# Patient Record
Sex: Female | Born: 1960 | Race: White | Hispanic: No | Marital: Single | Smoking: Never smoker
Health system: Southern US, Community
[De-identification: ages and names within clinical notes are randomized; demographics above are authoritative.]

## PROBLEM LIST (undated history)

## (undated) DIAGNOSIS — N289 Disorder of kidney and ureter, unspecified: Secondary | ICD-10-CM

## (undated) DIAGNOSIS — N6009 Solitary cyst of unspecified breast: Secondary | ICD-10-CM

## (undated) DIAGNOSIS — Z1239 Encounter for other screening for malignant neoplasm of breast: Secondary | ICD-10-CM

## (undated) DIAGNOSIS — N63 Unspecified lump in unspecified breast: Secondary | ICD-10-CM

## (undated) HISTORY — DX: Encounter for other screening for malignant neoplasm of breast: Z12.39

## (undated) HISTORY — DX: Solitary cyst of unspecified breast: N60.09

## (undated) HISTORY — DX: Unspecified lump in unspecified breast: N63.0

## (undated) HISTORY — PX: BREAST CYST ASPIRATION: SHX578

---

## 1999-09-20 HISTORY — PX: ABDOMINAL HYSTERECTOMY: SHX81

## 2009-04-28 ENCOUNTER — Ambulatory Visit: Payer: Self-pay | Admitting: Physician Assistant

## 2009-06-04 ENCOUNTER — Ambulatory Visit: Payer: Self-pay | Admitting: Pain Medicine

## 2009-07-01 ENCOUNTER — Ambulatory Visit: Payer: Self-pay | Admitting: Physician Assistant

## 2009-07-23 ENCOUNTER — Ambulatory Visit: Payer: Self-pay | Admitting: Physician Assistant

## 2009-08-20 ENCOUNTER — Ambulatory Visit: Payer: Self-pay | Admitting: Physician Assistant

## 2009-10-19 ENCOUNTER — Ambulatory Visit: Payer: Self-pay | Admitting: Pain Medicine

## 2009-12-21 ENCOUNTER — Ambulatory Visit: Payer: Self-pay | Admitting: Pain Medicine

## 2010-04-26 ENCOUNTER — Emergency Department: Payer: Self-pay | Admitting: Emergency Medicine

## 2011-09-20 DIAGNOSIS — Z1239 Encounter for other screening for malignant neoplasm of breast: Secondary | ICD-10-CM

## 2011-09-20 DIAGNOSIS — N6009 Solitary cyst of unspecified breast: Secondary | ICD-10-CM

## 2011-09-20 HISTORY — DX: Solitary cyst of unspecified breast: N60.09

## 2011-09-20 HISTORY — DX: Encounter for other screening for malignant neoplasm of breast: Z12.39

## 2012-09-19 DIAGNOSIS — N63 Unspecified lump in unspecified breast: Secondary | ICD-10-CM

## 2012-09-19 HISTORY — DX: Unspecified lump in unspecified breast: N63.0

## 2012-12-24 ENCOUNTER — Encounter: Payer: Self-pay | Admitting: *Deleted

## 2012-12-24 DIAGNOSIS — N6009 Solitary cyst of unspecified breast: Secondary | ICD-10-CM | POA: Insufficient documentation

## 2013-01-01 ENCOUNTER — Ambulatory Visit: Payer: Self-pay | Admitting: General Surgery

## 2013-08-07 ENCOUNTER — Encounter: Payer: Self-pay | Admitting: General Surgery

## 2013-08-27 ENCOUNTER — Encounter: Payer: Self-pay | Admitting: General Surgery

## 2013-08-27 ENCOUNTER — Ambulatory Visit (INDEPENDENT_AMBULATORY_CARE_PROVIDER_SITE_OTHER): Payer: 59 | Admitting: General Surgery

## 2013-08-27 VITALS — BP 124/78 | HR 72 | Resp 14 | Ht 66.0 in | Wt 147.0 lb

## 2013-08-27 DIAGNOSIS — N63 Unspecified lump in unspecified breast: Secondary | ICD-10-CM

## 2013-08-27 NOTE — Progress Notes (Signed)
Patient ID: Isabella Henderson, female   DOB: 02-10-1961, 52 y.o.   MRN: [id number]  Chief Complaint  Patient presents with  . Follow-up    mammogram    HPI Isabella Henderson is a 52 y.o. female.  who presents for her follow up breast evaluation. The most recent mammogram was done on 08-06-13.  Patient does perform regular self breast checks and gets regular mammograms done.  No new breast issues.  HPI  Past Medical History  Diagnosis Date  . Breast screening, unspecified 2013  . Lump or mass in breast 2014    cyst aspiration-right bst, 11o'clock 2013, right breast 6 o'clock 2014, both benign  . Solitary cyst of breast 2013    right breast-2013& 2014, benign    Past Surgical History  Procedure Laterality Date  . Abdominal hysterectomy  2001  . Breast cyst aspiration Right 2013,2014    right bst 11o'clock in 2014 and right breast 6 o'clock in 2014 1cm from nipple, both fna were benign    Family History  Problem Relation Age of Onset  . Cancer Maternal Grandmother 16    breast cancer and colon    Social History History  Substance Use Topics  . Smoking status: Never Smoker   . Smokeless tobacco: Not on file  . Alcohol Use: Yes    Allergies  Allergen Reactions  . Sulfa Antibiotics Nausea Only    Current Outpatient Prescriptions  Medication Sig Dispense Refill  . carisoprodol (SOMA) 350 MG tablet Take 350 mg by mouth every 6 (six) hours as needed for muscle spasms.      . traMADol (ULTRAM) 50 MG tablet Take 50 mg by mouth 3 (three) times daily.      Marland Kitchen ALPRAZolam (XANAX) 0.25 MG tablet Take 0.25 mg by mouth daily.      . Cholecalciferol (VITAMIN D-3 PO) Take 2,000 tablets by mouth daily.       No current facility-administered medications for this visit.    Review of Systems Review of Systems  Constitutional: Negative.   Respiratory: Negative.   Cardiovascular: Negative.     Blood pressure 124/78, pulse 72, resp. rate 14, height 5\' 6"  (1.676 m), weight 147 lb  (66.679 kg).  Physical Exam Physical Exam  Constitutional: She is oriented to person, place, and time. She appears well-developed and well-nourished.  Neck: Neck supple.  Cardiovascular: Normal rate, regular rhythm and normal heart sounds.   Pulmonary/Chest: Effort normal and breath sounds normal. Right breast exhibits no inverted nipple, no mass, no nipple discharge, no skin change and no tenderness. Left breast exhibits no inverted nipple, no mass, no nipple discharge, no skin change and no tenderness.  Lymphadenopathy:    She has no cervical adenopathy.    She has no axillary adenopathy.  Neurological: She is alert and oriented to person, place, and time.  Skin: Skin is warm and dry.    Data Reviewed Bilateral mammograms completed [facility] dated August 06, 2013 were reviewed.dense breast were identified. No areas of suspicious microcalcifications or focal asymmetry were appreciated. BI-RAD-2. FNA sample of a cyst dated November 08, 2012 showed a cellular specimen.  Assessment    Benign breast exam.     Plan    The patient will resume annual screening mammograms with her PCP in November 2015.        Isabella Henderson 08/27/2013, 8:34 PM

## 2013-08-27 NOTE — Patient Instructions (Signed)
Continue self breast exams. Call office for any new breast issues or concerns. Continue yearly checks with primary care Dr Marcello Fennel

## 2013-09-25 ENCOUNTER — Encounter: Payer: Self-pay | Admitting: General Surgery

## 2014-07-21 ENCOUNTER — Encounter: Payer: Self-pay | Admitting: General Surgery

## 2015-02-03 ENCOUNTER — Other Ambulatory Visit: Payer: Self-pay | Admitting: Internal Medicine

## 2015-02-03 DIAGNOSIS — M47816 Spondylosis without myelopathy or radiculopathy, lumbar region: Secondary | ICD-10-CM

## 2015-02-03 DIAGNOSIS — M4310 Spondylolisthesis, site unspecified: Secondary | ICD-10-CM

## 2015-02-11 ENCOUNTER — Ambulatory Visit
Admission: RE | Admit: 2015-02-11 | Discharge: 2015-02-11 | Disposition: A | Discharge status: Home / Self Care | Payer: 59 | Source: Ambulatory Visit | Attending: Internal Medicine | Admitting: Internal Medicine

## 2015-02-11 DIAGNOSIS — M479 Spondylosis, unspecified: Secondary | ICD-10-CM | POA: Diagnosis present

## 2015-02-11 DIAGNOSIS — M47816 Spondylosis without myelopathy or radiculopathy, lumbar region: Secondary | ICD-10-CM

## 2015-02-11 DIAGNOSIS — M4310 Spondylolisthesis, site unspecified: Secondary | ICD-10-CM

## 2015-02-11 DIAGNOSIS — M5136 Other intervertebral disc degeneration, lumbar region: Secondary | ICD-10-CM | POA: Insufficient documentation

## 2015-02-11 DIAGNOSIS — G544 Lumbosacral root disorders, not elsewhere classified: Secondary | ICD-10-CM | POA: Diagnosis not present

## 2015-02-11 MED ORDER — GADOBENATE DIMEGLUMINE 529 MG/ML IV SOLN
13.0000 mL | Freq: Once | INTRAVENOUS | Status: AC | PRN
  Administered 2015-02-11: 13 mL via INTRAVENOUS

## 2016-05-26 IMAGING — MR MR LUMBAR SPINE WO/W CM
6 of 7 series · 34 of 48 positions shown · IV contrast (13 ml Multihance)
Comparison: None.

CLINICAL DATA: Low back pain with left buttock and leg pain

EXAM:
MRI LUMBAR SPINE WITHOUT AND WITH CONTRAST
TECHNIQUE: Multiplanar and multiecho pulse sequences of the lumbar spine were
obtained without and with intravenous contrast.
CONTRAST:  13mL MULTIHANCE GADOBENATE DIMEGLUMINE 529 MG/ML IV SOLN

[Series 2: T2 · sagittal · 4.0mm · 0.81mm/px · 3 of 15 slices shown (1 of 2)]
[im 1/15]
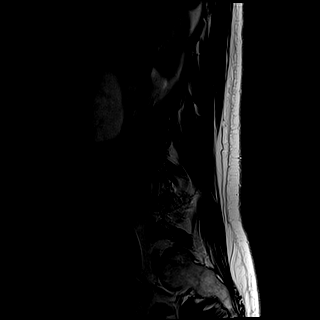
[im 8/15]
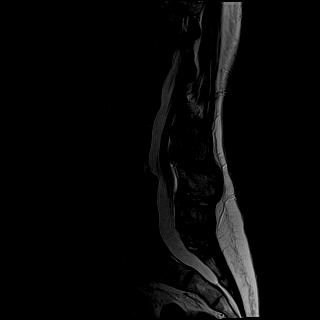
[im 15/15]
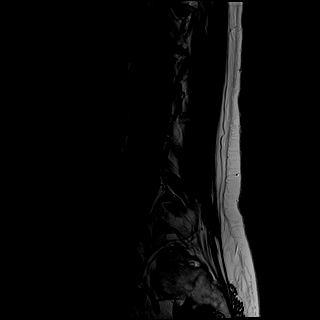

[Series 3: T1 · sagittal · 4.0mm · 0.81mm/px · 4 of 15 slices shown (1 of 2)]
[im 1/15]
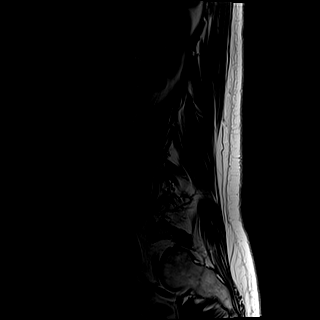
[im 5/15]
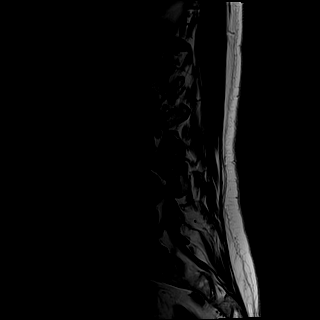
[im 10/15]
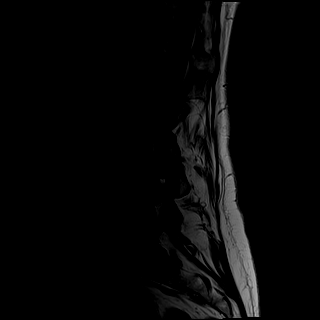
[im 15/15]
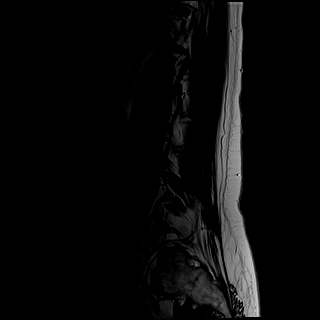

[Series 4: STIR · sagittal · 4.0mm · 1.02mm/px · 1 of 15 slices shown]
[im 1/15]
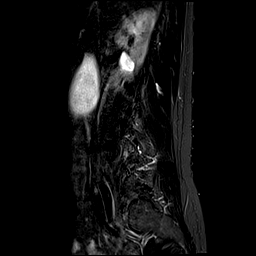

[Series 5: T2 · axial · 4.0mm · 0.78mm/px · z∈[-51,+146]mm · 11 of 39 slices shown (2 of 2)]
[im 1/39]
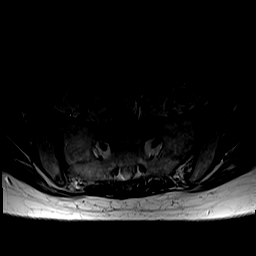
[im 4/39]
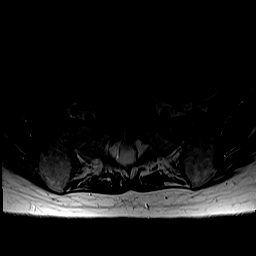
[im 8/39]
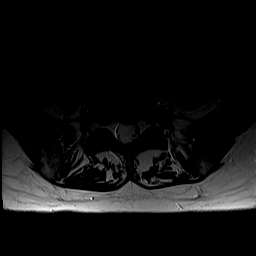
[im 12/39]
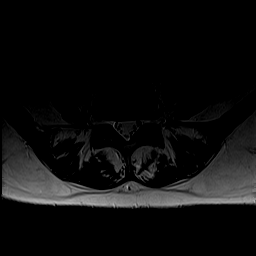
[im 16/39]
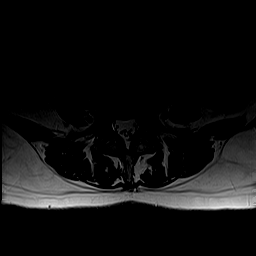
[im 20/39]
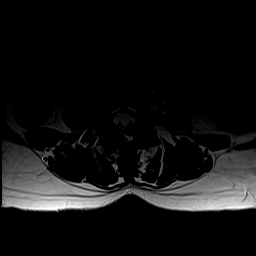
[im 23/39]
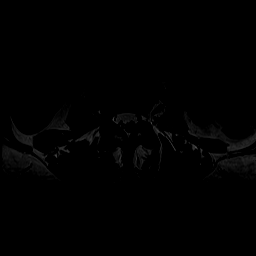
[im 27/39]
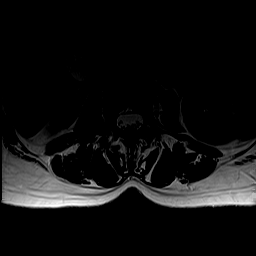
[im 31/39]
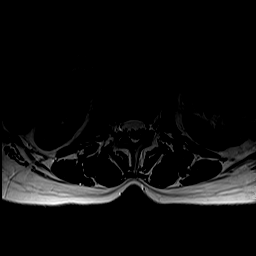
[im 35/39]
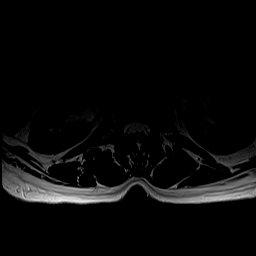
[im 39/39]
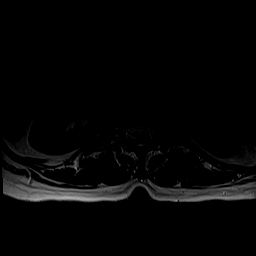

[Series 6: T1 · axial · 4.0mm · 0.39mm/px · z∈[-51,+146]mm · 11 of 39 slices shown (2 of 2)]
[im 1/39]
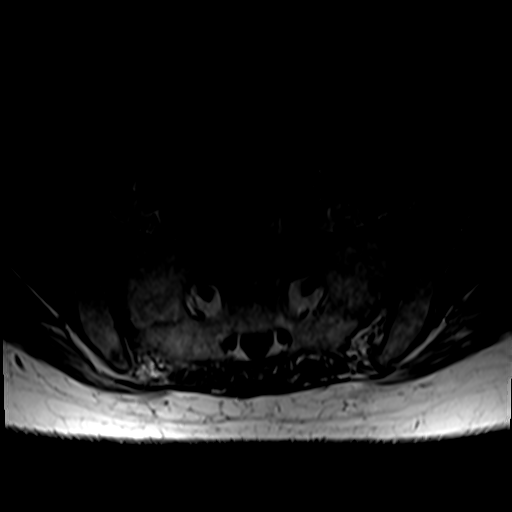
[im 4/39]
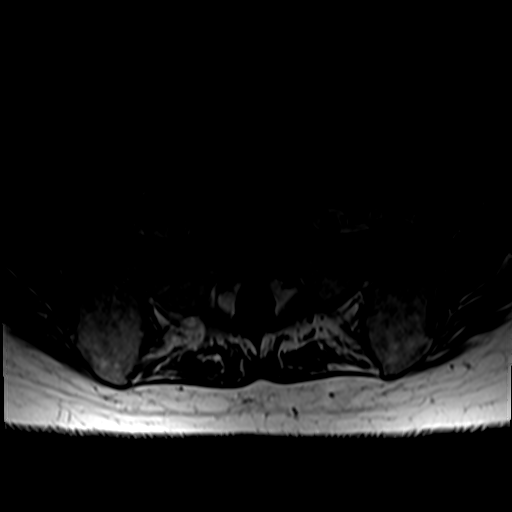
[im 8/39]
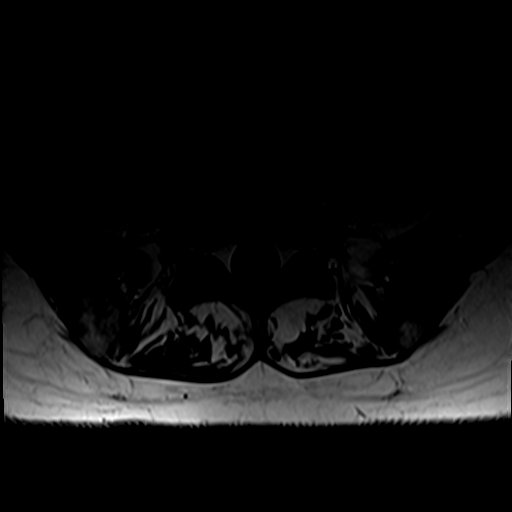
[im 12/39]
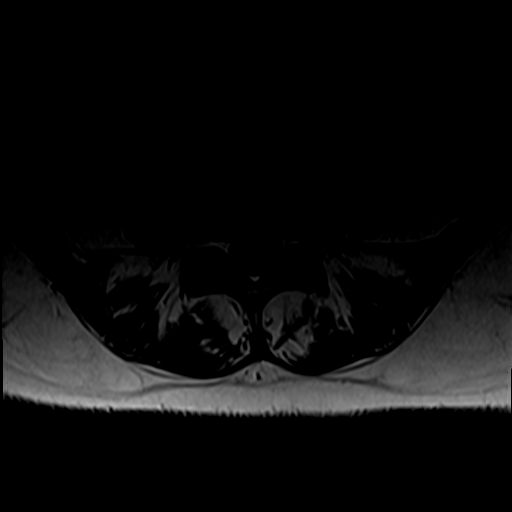
[im 16/39]
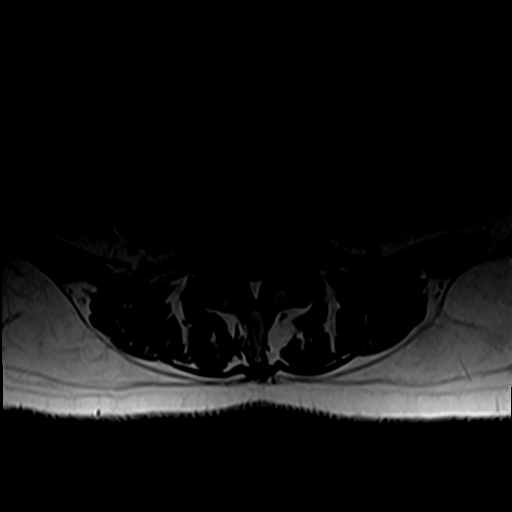
[im 20/39]
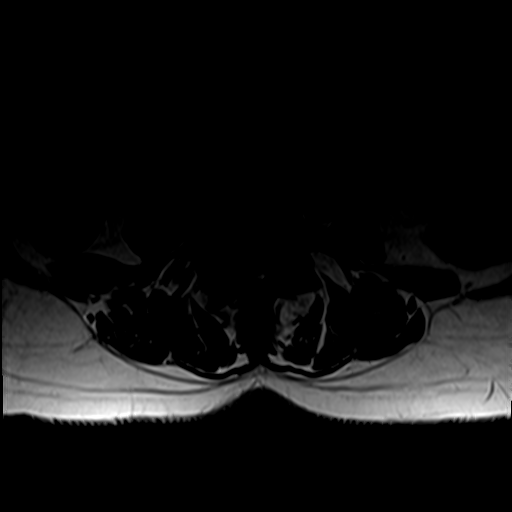
[im 23/39]
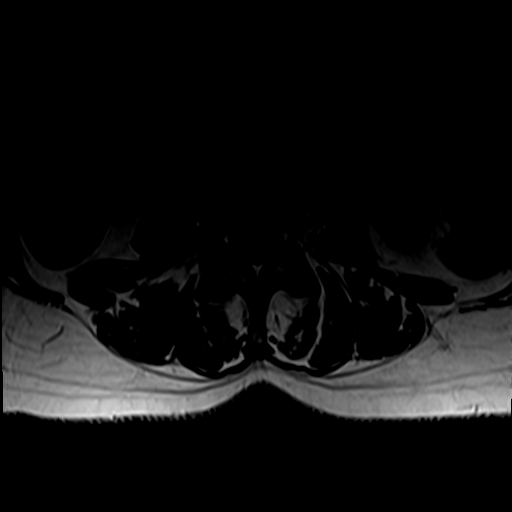
[im 27/39]
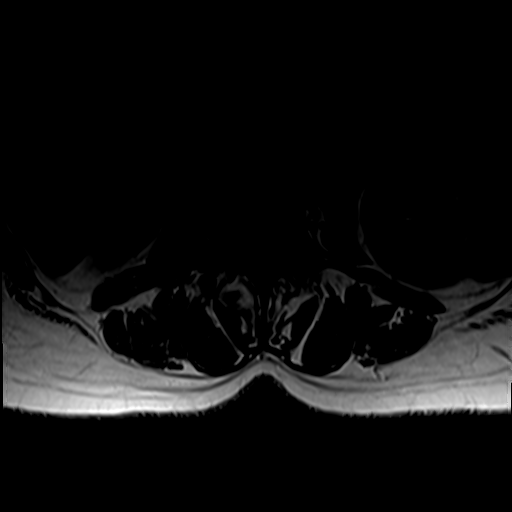
[im 31/39]
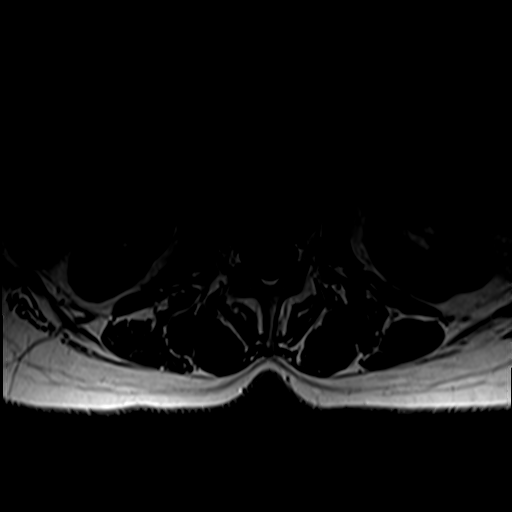
[im 35/39]
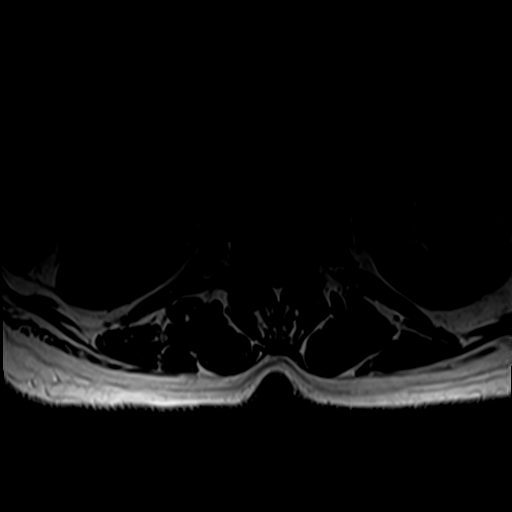
[im 39/39]
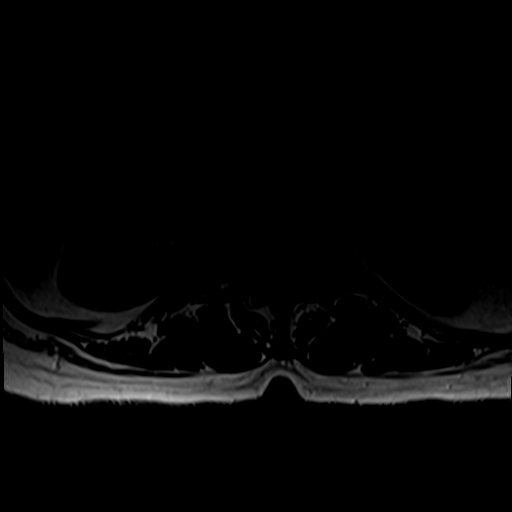

[Series 7: T1 fat-sat post-contrast · sagittal · 4.0mm · 0.81mm/px · 4 of 15 slices shown]
[im 1/15]
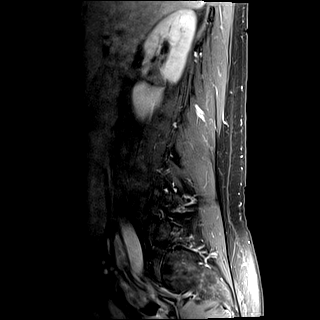
[im 5/15]
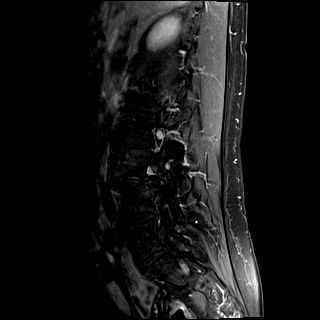
[im 10/15]
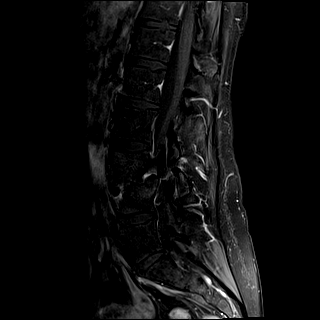
[im 15/15]
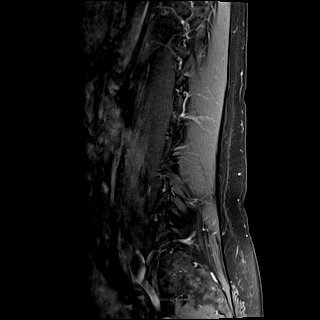

[34 of 48 positions shown; findings below may reference images not displayed]

FINDINGS: Grade 2 slip L3-4. Remaining alignment is normal. Negative for
fracture or mass lesion. No enhancing mass lesion. Conus medullaris
normal and terminates at mid L1

T12-L1:  Mild disc degeneration

L1-2:  Mild disc degeneration and Schmorl's node without stenosis

L2-3:  Normal disc.  Early facet degeneration without stenosis

L3-4: 11 mm anterior slip with severe disc degeneration and endplate
sclerosis. Complete loss of disc space. Severe left foraminal
encroachment with compression of the left L3 nerve root in the
foramen. Mild right foraminal narrowing. Spinal canal is adequate in
size. Bilateral pars defects of L3 noted.

L4-5:  Mild disc degeneration without stenosis

L5-S1:  Negative
IMPRESSION: Bilateral pars defects of L3 with grade 2 slip L3 on L4 and severe
disc degeneration. Left L3 nerve root compression due to severe
foraminal encroachment.

## 2016-12-27 ENCOUNTER — Ambulatory Visit: Payer: Self-pay | Admitting: Podiatry

## 2018-11-10 ENCOUNTER — Emergency Department
Admission: EM | Admit: 2018-11-10 | Discharge: 2018-11-10 | Disposition: A | Discharge status: Home / Self Care | Payer: Managed Care, Other (non HMO) | Attending: Emergency Medicine | Admitting: Emergency Medicine

## 2018-11-10 ENCOUNTER — Emergency Department: Payer: Managed Care, Other (non HMO)

## 2018-11-10 ENCOUNTER — Encounter: Payer: Self-pay | Admitting: Emergency Medicine

## 2018-11-10 ENCOUNTER — Other Ambulatory Visit: Payer: Self-pay

## 2018-11-10 DIAGNOSIS — Z79899 Other long term (current) drug therapy: Secondary | ICD-10-CM | POA: Diagnosis not present

## 2018-11-10 DIAGNOSIS — R300 Dysuria: Secondary | ICD-10-CM | POA: Insufficient documentation

## 2018-11-10 DIAGNOSIS — R109 Unspecified abdominal pain: Secondary | ICD-10-CM | POA: Diagnosis present

## 2018-11-10 DIAGNOSIS — R3915 Urgency of urination: Secondary | ICD-10-CM | POA: Insufficient documentation

## 2018-11-10 DIAGNOSIS — N200 Calculus of kidney: Secondary | ICD-10-CM | POA: Insufficient documentation

## 2018-11-10 HISTORY — DX: Disorder of kidney and ureter, unspecified: N28.9

## 2018-11-10 LAB — URINALYSIS, COMPLETE (UACMP) WITH MICROSCOPIC
Bacteria, UA: NONE SEEN
Bilirubin Urine: NEGATIVE
Glucose, UA: NEGATIVE mg/dL
KETONES UR: 5 mg/dL — AB
Leukocytes,Ua: NEGATIVE
Nitrite: NEGATIVE
Protein, ur: 30 mg/dL — AB
Specific Gravity, Urine: 1.024 (ref 1.005–1.030)
pH: 5 (ref 5.0–8.0)

## 2018-11-10 LAB — COMPREHENSIVE METABOLIC PANEL
ALT: 15 U/L (ref 0–44)
AST: 21 U/L (ref 15–41)
Albumin: 4.5 g/dL (ref 3.5–5.0)
Alkaline Phosphatase: 56 U/L (ref 38–126)
Anion gap: 9 (ref 5–15)
BUN: 12 mg/dL (ref 6–20)
CO2: 23 mmol/L (ref 22–32)
Calcium: 9 mg/dL (ref 8.9–10.3)
Chloride: 107 mmol/L (ref 98–111)
Creatinine, Ser: 0.81 mg/dL (ref 0.44–1.00)
GFR calc non Af Amer: >60 mL/min (ref >60)
Glucose, Bld: 123 mg/dL — ABNORMAL HIGH (ref 70–99)
Potassium: 4 mmol/L (ref 3.5–5.1)
Sodium: 139 mmol/L (ref 135–145)
Total Bilirubin: 0.4 mg/dL (ref 0.3–1.2)
Total Protein: 7.3 g/dL (ref 6.5–8.1)

## 2018-11-10 LAB — CBC
HEMATOCRIT: 44.2 % (ref 36.0–46.0)
Hemoglobin: 14.2 g/dL (ref 12.0–15.0)
MCH: 29.8 pg (ref 26.0–34.0)
MCHC: 32.1 g/dL (ref 30.0–36.0)
MCV: 92.7 fL (ref 80.0–100.0)
Platelets: 277 10*3/uL (ref 150–400)
RBC: 4.77 MIL/uL (ref 3.87–5.11)
RDW: 13.1 % (ref 11.5–15.5)
WBC: 9.8 10*3/uL (ref 4.0–10.5)
nRBC: 0 % (ref 0.0–0.2)

## 2018-11-10 LAB — LIPASE, BLOOD: Lipase: 25 U/L (ref 11–51)

## 2018-11-10 MED ORDER — SODIUM CHLORIDE 0.9 % IV BOLUS
1000.0000 mL | Freq: Once | INTRAVENOUS | Status: AC
  Administered 2018-11-10: 1000 mL via INTRAVENOUS

## 2018-11-10 MED ORDER — ONDANSETRON 4 MG PO TBDP: 4.0000 mg | ORAL_TABLET | Freq: Three times a day (TID) | ORAL | 0 refills | Status: AC | PRN

## 2018-11-10 MED ORDER — ONDANSETRON HCL 4 MG/2ML IJ SOLN
4.0000 mg | Freq: Once | INTRAMUSCULAR | Status: AC
  Administered 2018-11-10: 4 mg via INTRAVENOUS
  Filled 2018-11-10: qty 2

## 2018-11-10 MED ORDER — OXYCODONE-ACETAMINOPHEN 5-325 MG PO TABS: 1.0000 | ORAL_TABLET | ORAL | 0 refills | Status: AC | PRN

## 2018-11-10 MED ORDER — KETOROLAC TROMETHAMINE 30 MG/ML IJ SOLN
30.0000 mg | Freq: Once | INTRAMUSCULAR | Status: AC
  Administered 2018-11-10: 30 mg via INTRAVENOUS
  Filled 2018-11-10: qty 1

## 2018-11-10 NOTE — ED Triage Notes (Signed)
First Nurse Note:  Arrives from [facility] for ED evaluation of right flank pain.  States pain started this morning at around 0430.    AAOx3.  Skin warm and dry.  NAD

## 2018-11-10 NOTE — ED Notes (Addendum)
Pt states R sided pain that wraps from back to R pelvis. Began this AM. States nausea. Denies vomiting or diarrhea. Still has abd organs. States hx of kidney stone 10 years ago. Also c/o of dysuria and "dribbles" when she urinates. Denies hematuria. Appears uncomfortable. A&O. Family at bedside.

## 2018-11-10 NOTE — ED Provider Notes (Signed)
[facility] Emergency Department Provider Note  Time seen: 10:53 AM  I have reviewed the triage vital signs and the nursing notes.   HISTORY  Chief Complaint Flank Pain    HPI Isabella Henderson is a 58 y.o. female with a past medical history of 1 kidney stone, presents to the emergency department for right flank pain.  According to the patient around 430 this morning she was awoken from her sleep with right flank pain, severe in intensity.  Has been nauseated ever since with continued right flank pain.  Patient states one kidney stone approximately 10 years ago to which this feels somewhat similar.  Denies any hematuria but states she did have some urgency as well as dysuria this morning.  Denies vomiting.  Denies diarrhea.  Denies chest pain or shortness of breath.  Denies fever.   Past Medical History:  Diagnosis Date  . Breast screening, unspecified 2013  . Lump or mass in breast 2014   cyst aspiration-right bst, 11o'clock 2013, right breast 6 o'clock 2014, both benign  . Renal disorder    kidney stone  . Solitary cyst of breast 2013   right breast-2013& 2014, benign    Patient Active Problem List   Diagnosis Date Noted  . Solitary cyst of breast     Past Surgical History:  Procedure Laterality Date  . ABDOMINAL HYSTERECTOMY  2001  . BREAST CYST ASPIRATION Right 2013,2014   right bst 11o'clock in 2014 and right breast 6 o'clock in 2014 1cm from nipple, both fna were benign    Prior to Admission medications   Medication Sig Start Date End Date Taking? Authorizing Provider  ALPRAZolam (XANAX) 0.25 MG tablet Take 0.25 mg by mouth daily.    [provider]  carisoprodol (SOMA) 350 MG tablet Take 350 mg by mouth every 6 (six) hours as needed for muscle spasms.    [provider]  Cholecalciferol (VITAMIN D-3 PO) Take 2,000 tablets by mouth daily.    [provider]  traMADol (ULTRAM) 50 MG tablet Take 50 mg by mouth 3 (three)  times daily.    [provider]    Allergies  Allergen Reactions  . Sulfa Antibiotics Nausea Only    Family History  Problem Relation Age of Onset  . Cancer Maternal Grandmother 54       breast cancer and colon    Social History Social History   Tobacco Use  . Smoking status: Never Smoker  . Smokeless tobacco: Never Used  Substance Use Topics  . Alcohol use: Yes  . Drug use: No    Review of Systems Constitutional: Negative for fever. Cardiovascular: Negative for chest pain. Respiratory: Negative for shortness of breath. Gastrointestinal: Right flank pain x6 hours. Genitourinary: Negative for urinary compaints Musculoskeletal: Negative for musculoskeletal complaints Skin: Negative for skin complaints  Neurological: Negative for headache All other ROS negative  ____________________________________________   PHYSICAL EXAM:  VITAL SIGNS: ED Triage Vitals  Enc Vitals Group     BP 11/10/18 1036 (!) 174/99     Pulse Rate 11/10/18 1036 72     Resp 11/10/18 1036 16     Temp 11/10/18 1036 98.3 F (36.8 C)     Temp Source 11/10/18 1036 Oral     SpO2 11/10/18 1036 98 %     Weight 11/10/18 1016 147 lb 0.8 oz (66.7 kg)     Height --      Head Circumference --      Peak  Flow --      Pain Score 11/10/18 1016 9     Pain Loc --      Pain Edu? --      Excl. in GC? --    Constitutional: Alert and oriented. Well appearing and in no distress. Eyes: Normal exam ENT   Head: Normocephalic and atraumatic.   Mouth/Throat: Mucous membranes are moist. Cardiovascular: Normal rate, regular rhythm. No murmur Respiratory: Normal respiratory effort without tachypnea nor retractions. Breath sounds are clear  Gastrointestinal: Soft, minimal right lower quadrant tenderness otherwise benign abdomen.  No rebound guarding or distention.  No CVA tenderness. Musculoskeletal: Nontender with normal range of motion in all extremities. Neurologic:  Normal speech and language. No  gross focal neurologic deficits  Skin:  Skin is warm, dry and intact.  Psychiatric: Mood and affect are normal.   ____________________________________________     RADIOLOGY  IMPRESSION: Mild right-sided urinary tract obstruction secondary to a punctate stone within the dependent bladder.  Aortic Atherosclerosis (ICD10-I70.0).  ____________________________________________   INITIAL IMPRESSION / ASSESSMENT AND PLAN / ED COURSE  Pertinent labs & imaging results that were available during my care of the patient were reviewed by me and considered in my medical decision making (see chart for details).  Patient presents to the emergency department for right flank pain, severe in intensity sharp started around 430 this morning.  Differential would include ureterolithiasis, urinary tract infection, pyelonephritis, cholecystitis, appendicitis, diverticulitis or colitis.  We will check labs, treat pain and nausea, IV hydrate we will obtain CT imaging without contrast the abdomen/pelvis to further evaluate.  Patient agreeable to plan of care.  CT shows mild right obstruction with a punctate stone.  Likely cause of patient's symptoms.  Urinalysis shows red blood cells otherwise negative.  Blood work otherwise largely within normal limits.  Patient states her pain is currently a 1/10 down from a 10/10 earlier.  We will place the patient on pain medication, nausea medication if needed.  I discussed over-the-counter ibuprofen for primary pain control.  I also discussed return precautions.  Patient agreeable to plan of care.  ____________________________________________   FINAL CLINICAL IMPRESSION(S) / ED DIAGNOSES  Right flank pain Kidney stone   Minna Antis, MD 11/10/18 1236

## 2018-11-10 NOTE — ED Notes (Signed)
MD at bedside. 

## 2018-11-10 NOTE — ED Notes (Signed)
Pt taken to CT scan, will administer medications when pt returns.

## 2018-11-10 NOTE — Discharge Instructions (Addendum)
Please take your medications as needed but only as prescribed.  Please drink plenty of fluids.  Return to the emergency department for any worsening abdominal pain or development of fever.

## 2020-02-23 IMAGING — CT CT RENAL STONE PROTOCOL
2 of 4 series · 17 of 46 positions shown, 19 images · non-contrast
Comparison: 04/26/2010

CLINICAL DATA: Right-sided pain beginning this morning. Nausea.
Remote history kidney stone. Dysuria.

EXAM:
CT ABDOMEN AND PELVIS WITHOUT CONTRAST
TECHNIQUE: Multidetector CT imaging of the abdomen and pelvis was performed
following the standard protocol without IV contrast.

[Series 2: stone full standard · axial · 0.72mm/px · z∈[-482,-62]mm · 14 of 92 slices shown, 16 images]
[im 4/92  soft-tissue]
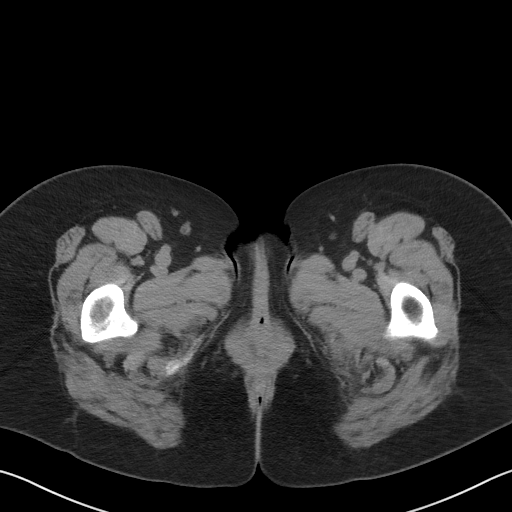
[im 4/92  bone]
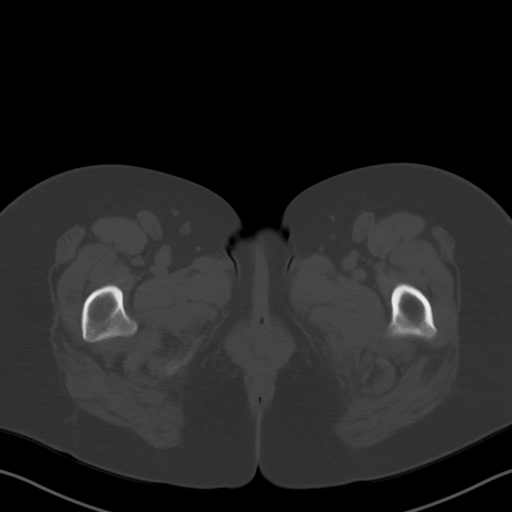
[im 12/92  soft-tissue]
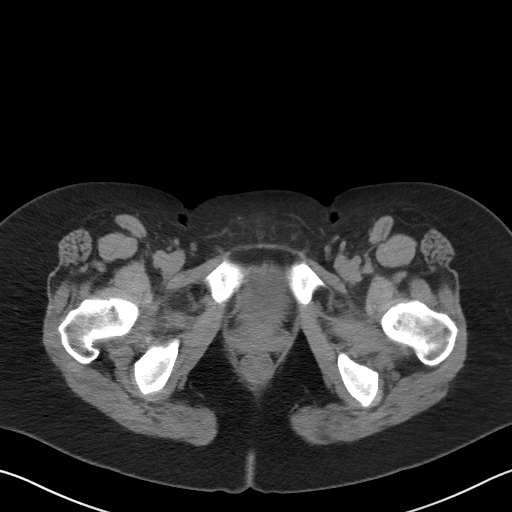
[im 19/92  soft-tissue]
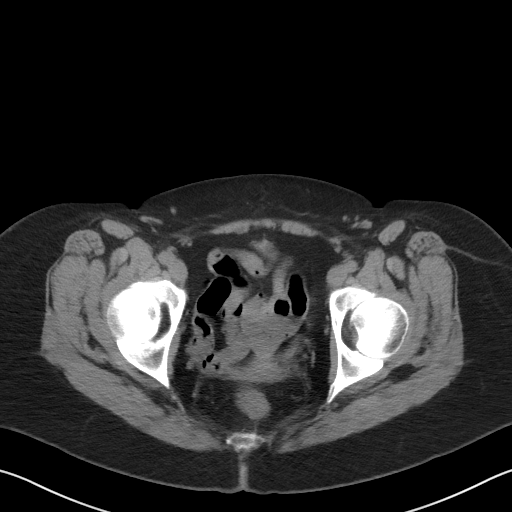
[im 23/92  soft-tissue]
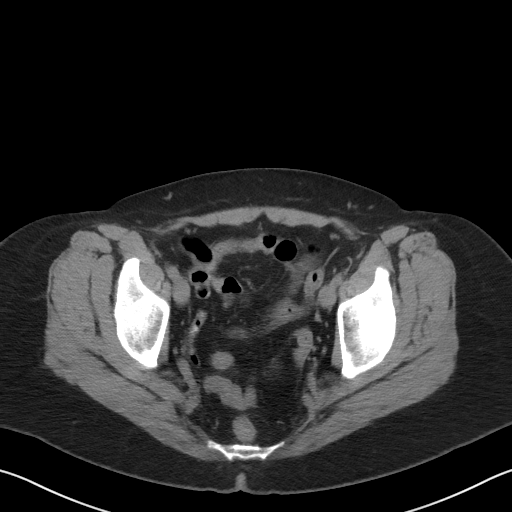
[im 31/92  soft-tissue]
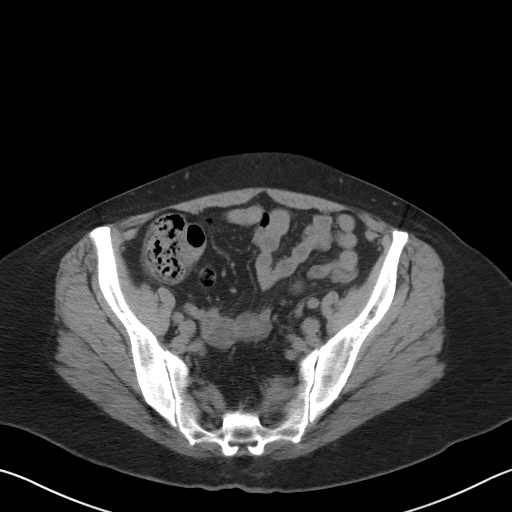
[im 38/92  soft-tissue]
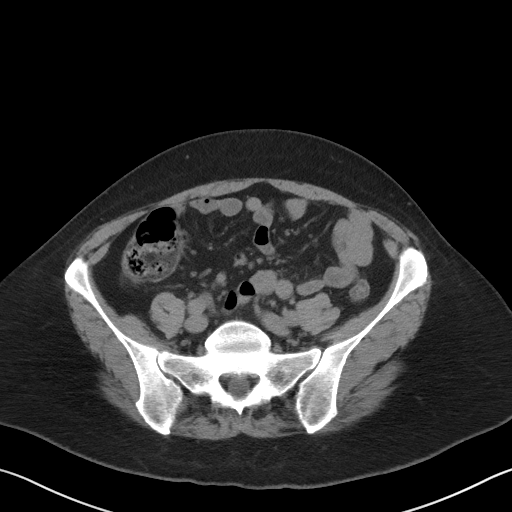
[im 42/92  soft-tissue]
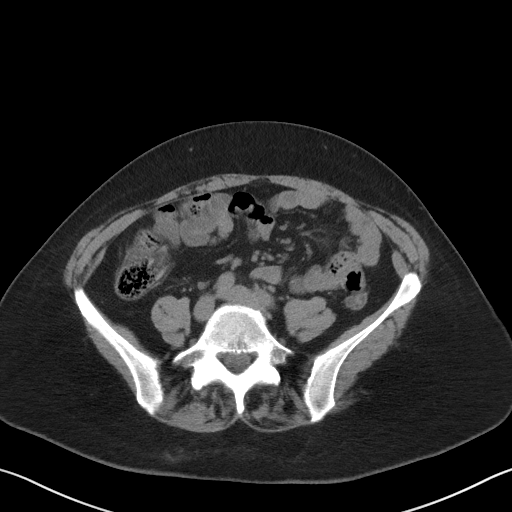
[im 50/92  soft-tissue]
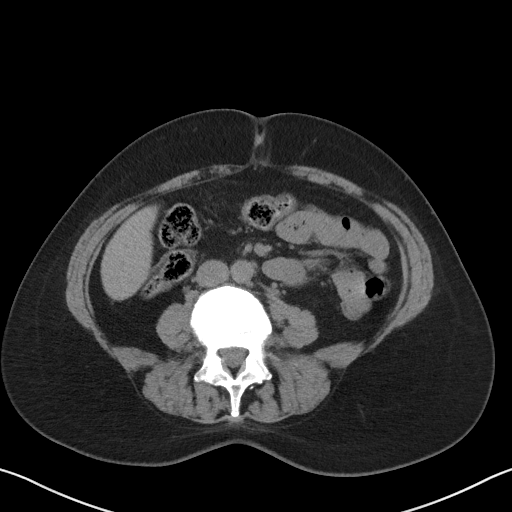
[im 54/92  soft-tissue]
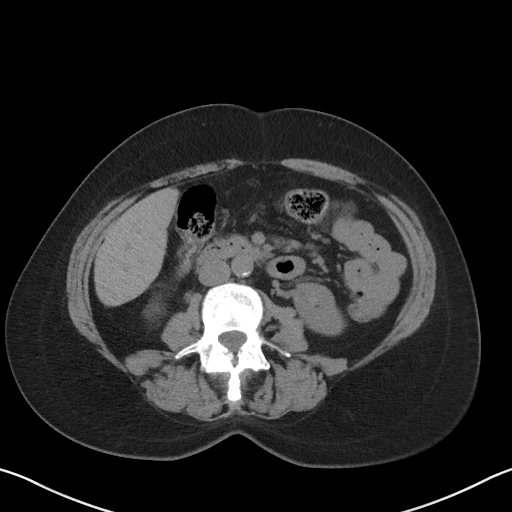
[im 54/92  bone]
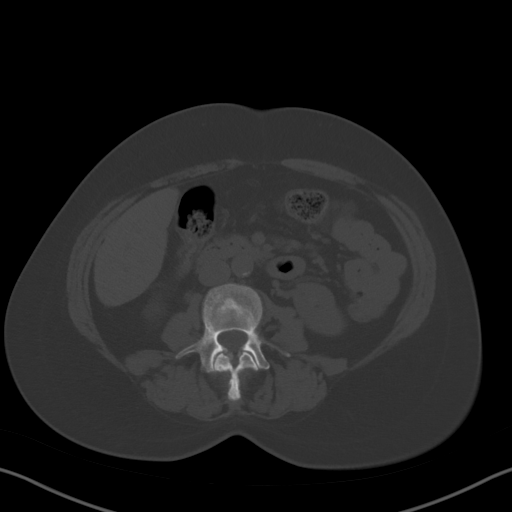
[im 61/92  soft-tissue]
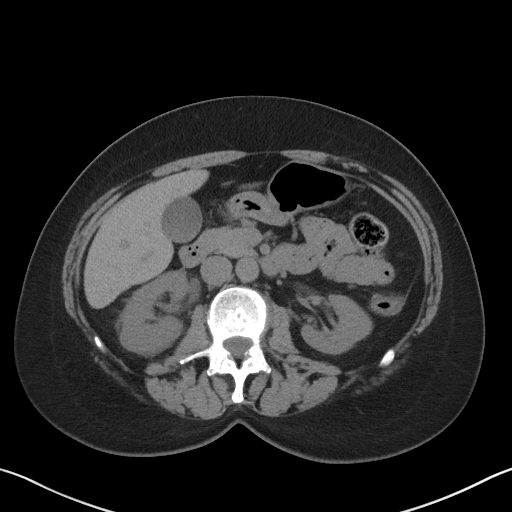
[im 69/92  soft-tissue]
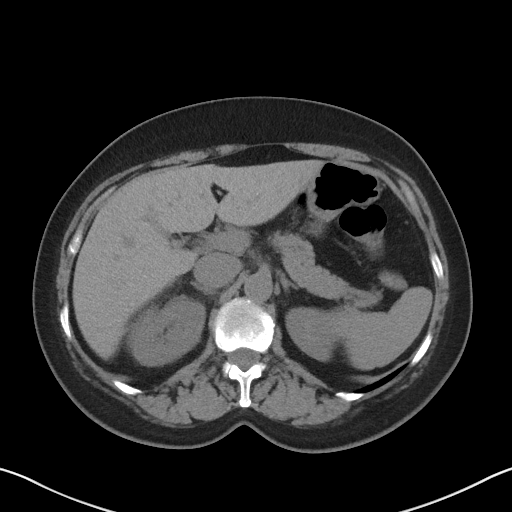
[im 73/92  soft-tissue]
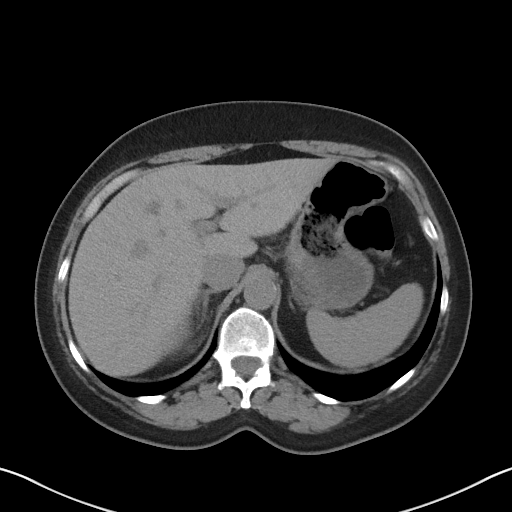
[im 80/92  soft-tissue]
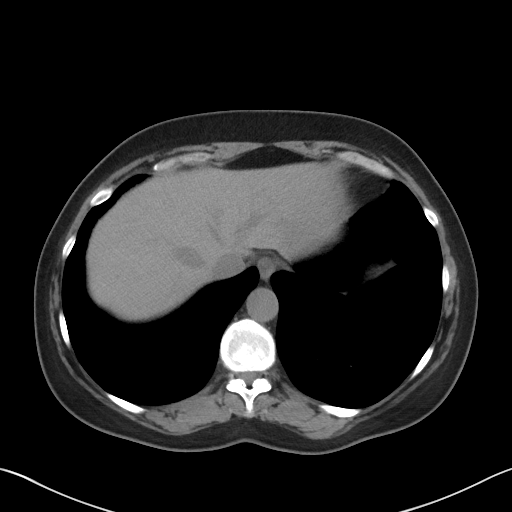
[im 88/92  soft-tissue]
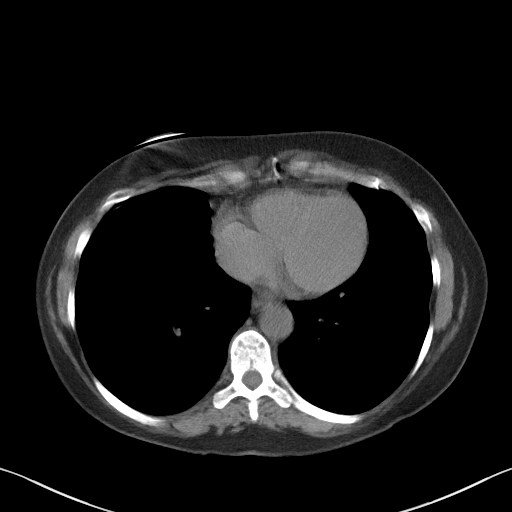

[Series 5: coronal · coronal · 0.88mm/px · 3 of 130 slices shown]
[im 44/130  soft-tissue]
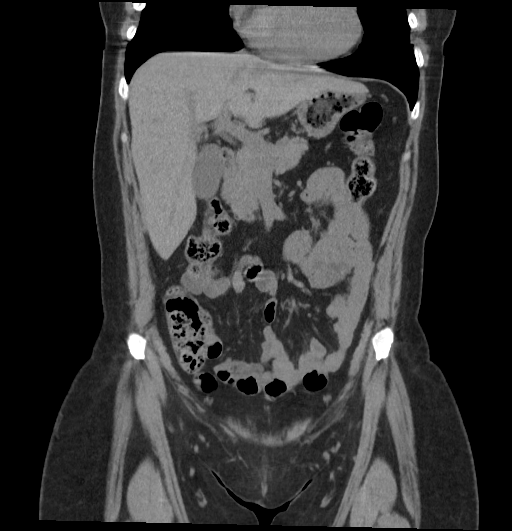
[im 58/130  soft-tissue]
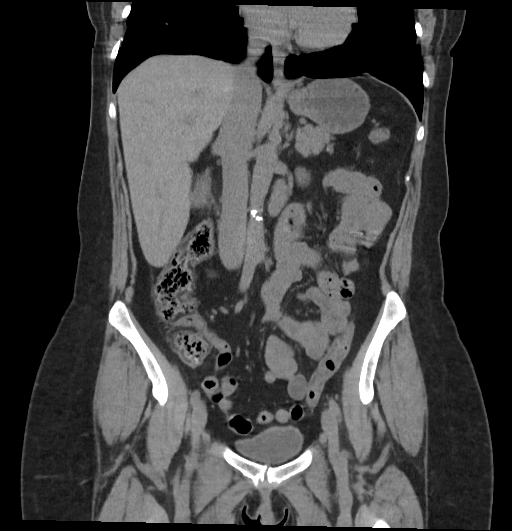
[im 72/130  soft-tissue]
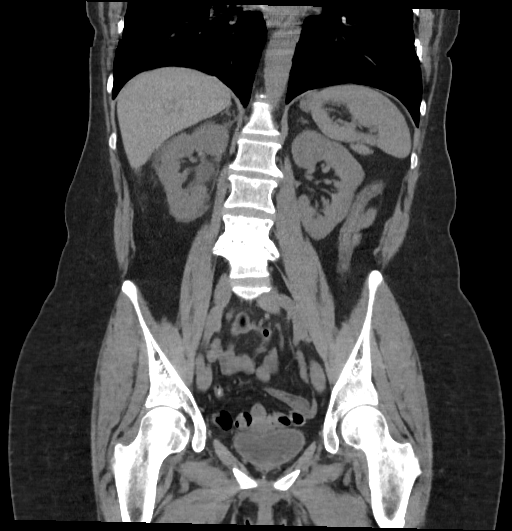

[17 of 46 positions shown; findings below may reference images not displayed]

FINDINGS: Lower chest: Clear lung bases. Normal heart size without pericardial
or pleural effusion.

Hepatobiliary: High left hepatic lobe cyst. Normal gallbladder,
without biliary ductal dilatation.

Pancreas: Normal, without mass or ductal dilatation.

Spleen: Normal in size, without focal abnormality.

Adrenals/Urinary Tract: Normal adrenal glands. No renal calculi.
Mild right hydronephrosis with moderate perinephric edema. Right
hydroureter is followed to the level of the urinary bladder. A
punctate stone is seen within the dependent bladder, including image
79/2. No left urinary tract calculi.

Stomach/Bowel: Normal stomach, without wall thickening. Normal
colon, appendix, and terminal ileum. Normal small bowel.

Vascular/Lymphatic: Aortic atherosclerosis. No abdominopelvic
adenopathy.

Reproductive: Hysterectomy.  No adnexal mass.

Other: No significant free fluid.

Musculoskeletal: Advanced degenerate disc disease at L3-4 with grade
2 anterolisthesis of L3 on L4. This is secondary to bilateral pars
defects and is grossly similar to the 02/11/2015 lumbar spine MRI.
IMPRESSION: Mild right-sided urinary tract obstruction secondary to a punctate
stone within the dependent bladder.

Aortic Atherosclerosis ([id number]).

## 2023-06-29 ENCOUNTER — Other Ambulatory Visit: Payer: Self-pay

## 2023-06-29 DIAGNOSIS — R42 Dizziness and giddiness: Secondary | ICD-10-CM | POA: Insufficient documentation

## 2023-06-29 DIAGNOSIS — N3000 Acute cystitis without hematuria: Secondary | ICD-10-CM | POA: Insufficient documentation

## 2023-06-29 DIAGNOSIS — R350 Frequency of micturition: Secondary | ICD-10-CM | POA: Diagnosis present

## 2023-06-29 LAB — CBC
HCT: 43 % (ref 36.0–46.0)
Hemoglobin: 14.1 g/dL (ref 12.0–15.0)
MCH: 29.1 pg (ref 26.0–34.0)
MCHC: 32.8 g/dL (ref 30.0–36.0)
MCV: 88.7 fL (ref 80.0–100.0)
Platelets: 290 10*3/uL (ref 150–400)
RBC: 4.85 MIL/uL (ref 3.87–5.11)
RDW: 12.9 % (ref 11.5–15.5)
WBC: 10.2 10*3/uL (ref 4.0–10.5)
nRBC: 0 % (ref 0.0–0.2)

## 2023-06-29 LAB — BASIC METABOLIC PANEL
Anion gap: 13 (ref 5–15)
BUN: 20 mg/dL (ref 6–20)
CO2: 23 mmol/L (ref 22–32)
Calcium: 9.5 mg/dL (ref 8.9–10.3)
Chloride: 99 mmol/L (ref 98–111)
Creatinine, Ser: 0.86 mg/dL (ref 0.44–1.00)
GFR, Estimated: >60 mL/min (ref >60)
Glucose, Bld: 111 mg/dL — ABNORMAL HIGH (ref 70–99)
Potassium: 4.6 mmol/L (ref 3.5–5.1)
Sodium: 135 mmol/L (ref 135–145)

## 2023-06-29 LAB — URINALYSIS, ROUTINE W REFLEX MICROSCOPIC
Bacteria, UA: NONE SEEN
Bilirubin Urine: NEGATIVE
Glucose, UA: NEGATIVE mg/dL
Hgb urine dipstick: NEGATIVE
Ketones, ur: NEGATIVE mg/dL
Nitrite: NEGATIVE
Protein, ur: NEGATIVE mg/dL
RBC / HPF: >50 RBC/hpf (ref 0–5)
Specific Gravity, Urine: 1.023 (ref 1.005–1.030)
pH: 5 (ref 5.0–8.0)

## 2023-06-29 LAB — CBG MONITORING, ED: Glucose-Capillary: 106 mg/dL — ABNORMAL HIGH (ref 70–99)

## 2023-06-29 NOTE — ED Triage Notes (Addendum)
Pt to ED via POV c/o lightheadedness and weakness. Pt reports she started feeling lightheaded at work around [employer] ago. Pt was waitressing. Pt endorses some tingling all over and mid back pain. Pt a&Ox4, answers all questions appropriately and able to move all extremities. Denies CP, SOB, fevers.

## 2023-06-30 ENCOUNTER — Emergency Department
Admission: EM | Admit: 2023-06-30 | Discharge: 2023-06-30 | Disposition: A | Discharge status: Home / Self Care | Payer: Managed Care, Other (non HMO) | Attending: Emergency Medicine | Admitting: Emergency Medicine

## 2023-06-30 DIAGNOSIS — N3000 Acute cystitis without hematuria: Secondary | ICD-10-CM

## 2023-06-30 DIAGNOSIS — R42 Dizziness and giddiness: Secondary | ICD-10-CM

## 2023-06-30 MED ORDER — CEPHALEXIN 500 MG PO CAPS
500.0000 mg | ORAL_CAPSULE | Freq: Once | ORAL | Status: AC
  Administered 2023-06-30: 500 mg via ORAL
  Filled 2023-06-30: qty 1

## 2023-06-30 MED ORDER — CEPHALEXIN 500 MG PO CAPS: 500.0000 mg | ORAL_CAPSULE | Freq: Three times a day (TID) | ORAL | 0 refills | Status: AC

## 2023-06-30 NOTE — ED Provider Notes (Signed)
[facility] Provider Note    Event Date/Time   First MD Initiated Contact with Patient 06/30/23 0026     (approximate)   History   Dizziness   HPI  Isabella Henderson is a 62 y.o. female who presents to the ED for evaluation of Dizziness   Reviewed PCP visit from 1 month ago.  Chronic low back pain, HLD  Patient presents alongside her father for evaluation of urinary frequency, presyncopal dizziness while at work.  No syncope, falls, vertigo, headache or head trauma.  No chest pain, dyspnea, abdominal pain, emesis or stool changes.  Does report urinary frequency, voiding 4 times while waiting a couple hours to come back to a room in the ED.   Physical Exam   Triage Vital Signs: ED Triage Vitals  Encounter Vitals Group     BP 06/29/23 2001 (!) 159/92     Systolic BP Percentile --      Diastolic BP Percentile --      Pulse Rate 06/29/23 2001 (!) 102     Resp 06/29/23 2001 20     Temp 06/29/23 2001 99.5 F (37.5 C)     Temp src --      SpO2 06/29/23 2001 97 %     Weight 06/29/23 1959 175 lb (79.4 kg)     Height 06/29/23 1959 5\' 6"  (1.676 m)     Head Circumference --      Peak Flow --      Pain Score 06/29/23 1959 7     Pain Loc --      Pain Education --      Exclude from Growth Chart --     Most recent vital signs: Vitals:   06/29/23 2001  BP: (!) 159/92  Pulse: (!) 102  Resp: 20  Temp: 99.5 F (37.5 C)  SpO2: 97%    General: Awake, no distress.  CV:  Good peripheral perfusion.  Resp:  Normal effort.  Abd:  No distention.  Mild suprapubic tenderness without peritoneal features.  Abdomen is otherwise benign. MSK:  No deformity noted.  Neuro:  No focal deficits appreciated. Other:     ED Results / Procedures / Treatments   Labs (all labs ordered are listed, but only abnormal results are displayed) Labs Reviewed  BASIC METABOLIC PANEL - Abnormal; Notable for the following components:      Result Value   Glucose, Bld 111 (*)     All other components within normal limits  URINALYSIS, ROUTINE W REFLEX MICROSCOPIC - Abnormal; Notable for the following components:   Color, Urine YELLOW (*)    APPearance HAZY (*)    Leukocytes,Ua MODERATE (*)    All other components within normal limits  CBG MONITORING, ED - Abnormal; Notable for the following components:   Glucose-Capillary 106 (*)    All other components within normal limits  URINE CULTURE  CBC    EKG Sinus tachycardia with rate of 106 bpm.  Normal axis and intervals.  No clear signs of acute ischemia.  RADIOLOGY   Official radiology report(s): No results found.  PROCEDURES and INTERVENTIONS:  .1-3 Lead EKG Interpretation  Performed by: Delton Prairie, MD Authorized by: Delton Prairie, MD     Interpretation: normal     ECG rate:  80   ECG rate assessment: normal     Rhythm: sinus rhythm     Ectopy: none     Conduction: normal     Medications  cephALEXin (KEFLEX) capsule  500 mg (has no administration in time range)     IMPRESSION / MDM / ASSESSMENT AND PLAN / ED COURSE  I reviewed the triage vital signs and the nursing notes.  Differential diagnosis includes, but is not limited to, UTI, sepsis, dehydration, cardiac dysrhythmia, hypoglycemia or other metabolic derangement  {Patient presents with symptoms of an acute illness or injury that is potentially life-threatening.  Patient presents from work due to some presyncopal dizziness while at work in the setting of acute urinary frequency, with evidence of acute cystitis suitable for outpatient management.  Triage mild tachycardia is noted but no other stigmata of sepsis or SIRS criteria.  Is a normal metabolic panel and CBC.  Urine with infectious features and we will send for culture.  Will start on Keflex empirically and discharged with return precautions.      FINAL CLINICAL IMPRESSION(S) / ED DIAGNOSES   Final diagnoses:  Dizziness  Acute cystitis without hematuria     Rx / DC Orders    ED Discharge Orders          Ordered    cephALEXin (KEFLEX) 500 MG capsule  3 times daily        06/30/23 0033             Note:  This document was prepared using Dragon voice recognition software and may include unintentional dictation errors.   Delton Prairie, MD 06/30/23 [phone]

## 2023-06-30 NOTE — Discharge Instructions (Addendum)
Keflex antibiotics 3 times daily for 5 days to treat a UTI

## 2023-07-01 LAB — URINE CULTURE: Culture: NO GROWTH
